# Patient Record
Sex: Male | Born: 1935 | Race: White | Hispanic: No | Marital: Married
Health system: Southern US, Community
[De-identification: ages and names within clinical notes are randomized; demographics above are authoritative.]

---

## 2006-10-25 ENCOUNTER — Inpatient Hospital Stay: Payer: Self-pay | Admitting: Vascular Surgery

## 2006-10-25 ENCOUNTER — Other Ambulatory Visit: Payer: Self-pay

## 2009-06-08 ENCOUNTER — Emergency Department: Payer: Self-pay | Admitting: Emergency Medicine

## 2009-09-09 ENCOUNTER — Emergency Department: Payer: Self-pay | Admitting: Emergency Medicine

## 2010-10-29 ENCOUNTER — Ambulatory Visit: Payer: Self-pay | Admitting: Internal Medicine

## 2011-07-06 ENCOUNTER — Encounter (INDEPENDENT_AMBULATORY_CARE_PROVIDER_SITE_OTHER): Payer: Medicare Other | Admitting: Ophthalmology

## 2011-07-06 DIAGNOSIS — H3530 Unspecified macular degeneration: Secondary | ICD-10-CM

## 2011-07-06 DIAGNOSIS — H43819 Vitreous degeneration, unspecified eye: Secondary | ICD-10-CM

## 2011-07-06 DIAGNOSIS — H353290 Exudative age-related macular degeneration, unspecified eye, stage unspecified: Secondary | ICD-10-CM

## 2011-08-19 ENCOUNTER — Encounter (INDEPENDENT_AMBULATORY_CARE_PROVIDER_SITE_OTHER): Payer: Medicare Other | Admitting: Ophthalmology

## 2011-08-19 DIAGNOSIS — H2700 Aphakia, unspecified eye: Secondary | ICD-10-CM

## 2011-08-19 DIAGNOSIS — H43819 Vitreous degeneration, unspecified eye: Secondary | ICD-10-CM

## 2011-08-19 DIAGNOSIS — H353290 Exudative age-related macular degeneration, unspecified eye, stage unspecified: Secondary | ICD-10-CM

## 2011-09-22 ENCOUNTER — Encounter (INDEPENDENT_AMBULATORY_CARE_PROVIDER_SITE_OTHER): Payer: Medicare Other | Admitting: Ophthalmology

## 2011-09-22 DIAGNOSIS — H353290 Exudative age-related macular degeneration, unspecified eye, stage unspecified: Secondary | ICD-10-CM

## 2011-09-22 DIAGNOSIS — H3530 Unspecified macular degeneration: Secondary | ICD-10-CM

## 2011-09-22 DIAGNOSIS — H43819 Vitreous degeneration, unspecified eye: Secondary | ICD-10-CM

## 2011-10-27 ENCOUNTER — Encounter (INDEPENDENT_AMBULATORY_CARE_PROVIDER_SITE_OTHER): Payer: Medicare Other | Admitting: Ophthalmology

## 2011-10-27 DIAGNOSIS — H3530 Unspecified macular degeneration: Secondary | ICD-10-CM

## 2011-10-27 DIAGNOSIS — H353290 Exudative age-related macular degeneration, unspecified eye, stage unspecified: Secondary | ICD-10-CM

## 2011-10-27 DIAGNOSIS — H43819 Vitreous degeneration, unspecified eye: Secondary | ICD-10-CM

## 2011-12-01 ENCOUNTER — Encounter (INDEPENDENT_AMBULATORY_CARE_PROVIDER_SITE_OTHER): Payer: Medicare Other | Admitting: Ophthalmology

## 2011-12-01 DIAGNOSIS — H43819 Vitreous degeneration, unspecified eye: Secondary | ICD-10-CM

## 2011-12-01 DIAGNOSIS — H3530 Unspecified macular degeneration: Secondary | ICD-10-CM

## 2011-12-01 DIAGNOSIS — H353290 Exudative age-related macular degeneration, unspecified eye, stage unspecified: Secondary | ICD-10-CM

## 2011-12-03 ENCOUNTER — Encounter (INDEPENDENT_AMBULATORY_CARE_PROVIDER_SITE_OTHER): Payer: Medicare Other | Admitting: Ophthalmology

## 2012-01-14 ENCOUNTER — Encounter (INDEPENDENT_AMBULATORY_CARE_PROVIDER_SITE_OTHER): Payer: Medicare Other | Admitting: Ophthalmology

## 2012-01-14 DIAGNOSIS — H3530 Unspecified macular degeneration: Secondary | ICD-10-CM

## 2012-01-14 DIAGNOSIS — H353290 Exudative age-related macular degeneration, unspecified eye, stage unspecified: Secondary | ICD-10-CM

## 2012-01-14 DIAGNOSIS — H43819 Vitreous degeneration, unspecified eye: Secondary | ICD-10-CM

## 2012-02-15 ENCOUNTER — Inpatient Hospital Stay: Payer: Self-pay | Admitting: Internal Medicine

## 2012-02-15 LAB — CBC
HCT: 41.6 % (ref 40.0–52.0)
HGB: 13.6 g/dL (ref 13.0–18.0)
MCH: 34.6 pg — ABNORMAL HIGH (ref 26.0–34.0)
MCV: 106 fL — ABNORMAL HIGH (ref 80–100)
RBC: 3.93 10*6/uL — ABNORMAL LOW (ref 4.40–5.90)
RDW: 15.3 % — ABNORMAL HIGH (ref 11.5–14.5)
WBC: 7.4 10*3/uL (ref 3.8–10.6)

## 2012-02-15 LAB — COMPREHENSIVE METABOLIC PANEL
Anion Gap: 9 (ref 7–16)
Calcium, Total: 8.8 mg/dL (ref 8.5–10.1)
Chloride: 104 mmol/L (ref 98–107)
Co2: 26 mmol/L (ref 21–32)
Creatinine: 1.29 mg/dL (ref 0.60–1.30)
EGFR (African American): >60
SGPT (ALT): 94 U/L — ABNORMAL HIGH
Sodium: 139 mmol/L (ref 136–145)
Total Protein: 6.8 g/dL (ref 6.4–8.2)

## 2012-02-15 LAB — TROPONIN I: Troponin-I: 0.03 ng/mL

## 2012-02-15 LAB — PRO B NATRIURETIC PEPTIDE: B-Type Natriuretic Peptide: 21499 pg/mL — ABNORMAL HIGH (ref 0–450)

## 2012-02-15 LAB — CK TOTAL AND CKMB (NOT AT ARMC): CK-MB: 3.1 ng/mL (ref 0.5–3.6)

## 2012-02-16 LAB — CK TOTAL AND CKMB (NOT AT ARMC)
CK, Total: 60 U/L (ref 35–232)
CK-MB: 2.5 ng/mL (ref 0.5–3.6)

## 2012-02-16 LAB — CBC WITH DIFFERENTIAL/PLATELET
Basophil #: 0 10*3/uL (ref 0.0–0.1)
Basophil %: 0.6 %
HGB: 12.4 g/dL — ABNORMAL LOW (ref 13.0–18.0)
Lymphocyte #: 1.7 10*3/uL (ref 1.0–3.6)
Lymphocyte %: 29.3 %
MCH: 34.7 pg — ABNORMAL HIGH (ref 26.0–34.0)
MCHC: 32.9 g/dL (ref 32.0–36.0)
MCV: 105 fL — ABNORMAL HIGH (ref 80–100)
Monocyte %: 12.5 %
Neutrophil #: 3.3 10*3/uL (ref 1.4–6.5)
Neutrophil %: 56.8 %
Platelet: 128 10*3/uL — ABNORMAL LOW (ref 150–440)
WBC: 5.9 10*3/uL (ref 3.8–10.6)

## 2012-02-16 LAB — BASIC METABOLIC PANEL
Calcium, Total: 8.2 mg/dL — ABNORMAL LOW (ref 8.5–10.1)
Creatinine: 1.39 mg/dL — ABNORMAL HIGH (ref 0.60–1.30)
Glucose: 101 mg/dL — ABNORMAL HIGH (ref 65–99)
Potassium: 4.4 mmol/L (ref 3.5–5.1)
Sodium: 144 mmol/L (ref 136–145)

## 2012-02-16 LAB — TROPONIN I: Troponin-I: 0.06 ng/mL — ABNORMAL HIGH

## 2012-02-17 LAB — CBC WITH DIFFERENTIAL/PLATELET
Basophil #: 0 10*3/uL (ref 0.0–0.1)
Basophil %: 0.2 %
Eosinophil %: 0.1 %
HCT: 34.6 % — ABNORMAL LOW (ref 40.0–52.0)
HGB: 11.3 g/dL — ABNORMAL LOW (ref 13.0–18.0)
MCH: 34.7 pg — ABNORMAL HIGH (ref 26.0–34.0)
MCHC: 32.7 g/dL (ref 32.0–36.0)
MCV: 106 fL — ABNORMAL HIGH (ref 80–100)
Monocyte #: 0.7 10*3/uL (ref 0.0–0.7)
Monocyte %: 9 %
Neutrophil #: 5.9 10*3/uL (ref 1.4–6.5)
Neutrophil %: 74.2 %
RBC: 3.26 10*6/uL — ABNORMAL LOW (ref 4.40–5.90)
WBC: 7.9 10*3/uL (ref 3.8–10.6)

## 2012-02-17 LAB — COMPREHENSIVE METABOLIC PANEL
Albumin: 2.6 g/dL — ABNORMAL LOW (ref 3.4–5.0)
BUN: 42 mg/dL — ABNORMAL HIGH (ref 7–18)
Bilirubin,Total: 0.5 mg/dL (ref 0.2–1.0)
Calcium, Total: 8.6 mg/dL (ref 8.5–10.1)
Creatinine: 1.43 mg/dL — ABNORMAL HIGH (ref 0.60–1.30)
EGFR (Non-African Amer.): 49 — ABNORMAL LOW
Glucose: 110 mg/dL — ABNORMAL HIGH (ref 65–99)

## 2012-02-18 LAB — CBC WITH DIFFERENTIAL/PLATELET
Basophil #: 0 10*3/uL (ref 0.0–0.1)
Eosinophil #: 0 10*3/uL (ref 0.0–0.7)
Eosinophil %: 0 %
HGB: 12.1 g/dL — ABNORMAL LOW (ref 13.0–18.0)
Lymphocyte %: 10.6 %
MCH: 34.9 pg — ABNORMAL HIGH (ref 26.0–34.0)
MCHC: 33.2 g/dL (ref 32.0–36.0)
MCV: 105 fL — ABNORMAL HIGH (ref 80–100)
Neutrophil #: 7.3 10*3/uL — ABNORMAL HIGH (ref 1.4–6.5)
Platelet: 141 10*3/uL — ABNORMAL LOW (ref 150–440)
WBC: 8.7 10*3/uL (ref 3.8–10.6)

## 2012-02-18 LAB — BASIC METABOLIC PANEL
Anion Gap: 13 (ref 7–16)
Calcium, Total: 8.8 mg/dL (ref 8.5–10.1)
Co2: 23 mmol/L (ref 21–32)
Creatinine: 1.4 mg/dL — ABNORMAL HIGH (ref 0.60–1.30)
EGFR (Non-African Amer.): 50 — ABNORMAL LOW
Osmolality: 297 (ref 275–301)
Potassium: 4.3 mmol/L (ref 3.5–5.1)
Sodium: 142 mmol/L (ref 136–145)

## 2012-02-21 LAB — CULTURE, BLOOD (SINGLE)

## 2012-02-25 ENCOUNTER — Encounter (INDEPENDENT_AMBULATORY_CARE_PROVIDER_SITE_OTHER): Payer: Medicare Other | Admitting: Ophthalmology

## 2012-02-25 DIAGNOSIS — H353290 Exudative age-related macular degeneration, unspecified eye, stage unspecified: Secondary | ICD-10-CM

## 2012-02-25 DIAGNOSIS — H3530 Unspecified macular degeneration: Secondary | ICD-10-CM

## 2012-02-25 DIAGNOSIS — H43819 Vitreous degeneration, unspecified eye: Secondary | ICD-10-CM

## 2012-02-27 ENCOUNTER — Inpatient Hospital Stay: Payer: Self-pay | Admitting: Internal Medicine

## 2012-02-27 LAB — CK TOTAL AND CKMB (NOT AT ARMC)
CK, Total: 116 U/L (ref 35–232)
CK-MB: 4.9 ng/mL — ABNORMAL HIGH (ref 0.5–3.6)

## 2012-02-27 LAB — COMPREHENSIVE METABOLIC PANEL
Albumin: 3.5 g/dL (ref 3.4–5.0)
Alkaline Phosphatase: 93 U/L (ref 50–136)
Anion Gap: 12 (ref 7–16)
Calcium, Total: 8.4 mg/dL — ABNORMAL LOW (ref 8.5–10.1)
Chloride: 104 mmol/L (ref 98–107)
Co2: 22 mmol/L (ref 21–32)
Glucose: 124 mg/dL — ABNORMAL HIGH (ref 65–99)
Osmolality: 290 (ref 275–301)
SGOT(AST): 53 U/L — ABNORMAL HIGH (ref 15–37)
Sodium: 138 mmol/L (ref 136–145)
Total Protein: 6.6 g/dL (ref 6.4–8.2)

## 2012-02-27 LAB — CBC
HCT: 41.3 % (ref 40.0–52.0)
HGB: 13.5 g/dL (ref 13.0–18.0)
MCH: 34.8 pg — ABNORMAL HIGH (ref 26.0–34.0)
MCHC: 32.7 g/dL (ref 32.0–36.0)
MCV: 106 fL — ABNORMAL HIGH (ref 80–100)
RDW: 14.6 % — ABNORMAL HIGH (ref 11.5–14.5)

## 2012-02-27 LAB — TSH: Thyroid Stimulating Horm: 6.23 u[IU]/mL — ABNORMAL HIGH

## 2012-02-27 LAB — POTASSIUM: Potassium: 4.5 mmol/L (ref 3.5–5.1)

## 2012-02-28 LAB — CBC WITH DIFFERENTIAL/PLATELET
Basophil #: 0 10*3/uL (ref 0.0–0.1)
Basophil %: 0.1 %
Eosinophil %: 0.1 %
HCT: 37.1 % — ABNORMAL LOW (ref 40.0–52.0)
HGB: 12.5 g/dL — ABNORMAL LOW (ref 13.0–18.0)
Monocyte #: 1 x10 3/mm (ref 0.2–1.0)
Monocyte %: 9.4 %
Neutrophil #: 8 10*3/uL — ABNORMAL HIGH (ref 1.4–6.5)
Neutrophil %: 78.8 %
RBC: 3.52 10*6/uL — ABNORMAL LOW (ref 4.40–5.90)
RDW: 15.2 % — ABNORMAL HIGH (ref 11.5–14.5)

## 2012-02-28 LAB — LIPID PANEL
Cholesterol: 146 mg/dL (ref 0–200)
Ldl Cholesterol, Calc: 60 mg/dL (ref 0–100)
VLDL Cholesterol, Calc: 30 mg/dL (ref 5–40)

## 2012-02-28 LAB — CK TOTAL AND CKMB (NOT AT ARMC)
CK, Total: 45 U/L (ref 35–232)
CK-MB: 1.7 ng/mL (ref 0.5–3.6)

## 2012-02-28 LAB — BASIC METABOLIC PANEL
BUN: 51 mg/dL — ABNORMAL HIGH (ref 7–18)
Calcium, Total: 7.6 mg/dL — ABNORMAL LOW (ref 8.5–10.1)
Co2: 21 mmol/L (ref 21–32)
Creatinine: 1.83 mg/dL — ABNORMAL HIGH (ref 0.60–1.30)
EGFR (Non-African Amer.): 31 — ABNORMAL LOW
Glucose: 167 mg/dL — ABNORMAL HIGH (ref 65–99)
Osmolality: 297 (ref 275–301)
Potassium: 4.3 mmol/L (ref 3.5–5.1)

## 2012-02-28 LAB — TROPONIN I
Troponin-I: 0.33 ng/mL — ABNORMAL HIGH
Troponin-I: 0.22 ng/mL — ABNORMAL HIGH

## 2012-02-29 LAB — CBC WITH DIFFERENTIAL/PLATELET
Eosinophil %: 0.7 %
HCT: 35.2 % — ABNORMAL LOW (ref 40.0–52.0)
HGB: 11.6 g/dL — ABNORMAL LOW (ref 13.0–18.0)
MCH: 35 pg — ABNORMAL HIGH (ref 26.0–34.0)
MCV: 106 fL — ABNORMAL HIGH (ref 80–100)
Monocyte #: 0.8 x10 3/mm (ref 0.2–1.0)
Monocyte %: 8.8 %
Neutrophil #: 6.5 10*3/uL (ref 1.4–6.5)
Neutrophil %: 74.5 %
Platelet: 133 10*3/uL — ABNORMAL LOW (ref 150–440)
RBC: 3.31 10*6/uL — ABNORMAL LOW (ref 4.40–5.90)
WBC: 8.7 10*3/uL (ref 3.8–10.6)

## 2012-02-29 LAB — BASIC METABOLIC PANEL
BUN: 48 mg/dL — ABNORMAL HIGH (ref 7–18)
Chloride: 105 mmol/L (ref 98–107)
Co2: 24 mmol/L (ref 21–32)
Creatinine: 2.02 mg/dL — ABNORMAL HIGH (ref 0.60–1.30)
Osmolality: 290 (ref 275–301)
Sodium: 139 mmol/L (ref 136–145)

## 2012-03-01 LAB — CBC WITH DIFFERENTIAL/PLATELET
Basophil #: 0 10*3/uL (ref 0.0–0.1)
Basophil %: 0.1 %
Eosinophil #: 0.1 10*3/uL (ref 0.0–0.7)
Eosinophil %: 0.8 %
HCT: 34.2 % — ABNORMAL LOW (ref 40.0–52.0)
HGB: 11.3 g/dL — ABNORMAL LOW (ref 13.0–18.0)
Lymphocyte #: 1.2 10*3/uL (ref 1.0–3.6)
Lymphocyte %: 13.8 %
MCV: 106 fL — ABNORMAL HIGH (ref 80–100)
Monocyte #: 0.6 x10 3/mm (ref 0.2–1.0)
Neutrophil #: 6.7 10*3/uL — ABNORMAL HIGH (ref 1.4–6.5)
Platelet: 144 10*3/uL — ABNORMAL LOW (ref 150–440)
RDW: 14.9 % — ABNORMAL HIGH (ref 11.5–14.5)
WBC: 8.6 10*3/uL (ref 3.8–10.6)

## 2012-03-01 LAB — BASIC METABOLIC PANEL
Anion Gap: 11 (ref 7–16)
Calcium, Total: 7.9 mg/dL — ABNORMAL LOW (ref 8.5–10.1)
Chloride: 106 mmol/L (ref 98–107)
Creatinine: 1.54 mg/dL — ABNORMAL HIGH (ref 0.60–1.30)
EGFR (African American): 44 — ABNORMAL LOW
EGFR (Non-African Amer.): 38 — ABNORMAL LOW
Sodium: 137 mmol/L (ref 136–145)

## 2012-03-02 LAB — CBC WITH DIFFERENTIAL/PLATELET
Basophil #: 0 10*3/uL (ref 0.0–0.1)
Basophil %: 0.2 %
Eosinophil #: 0.1 10*3/uL (ref 0.0–0.7)
Eosinophil %: 1.3 %
HCT: 36.1 % — ABNORMAL LOW (ref 40.0–52.0)
HGB: 12.2 g/dL — ABNORMAL LOW (ref 13.0–18.0)
Lymphocyte #: 1.3 10*3/uL (ref 1.0–3.6)
Lymphocyte %: 15 %
MCH: 35.9 pg — ABNORMAL HIGH (ref 26.0–34.0)
MCHC: 33.7 g/dL (ref 32.0–36.0)
MCV: 106 fL — ABNORMAL HIGH (ref 80–100)
Monocyte #: 0.7 x10 3/mm (ref 0.2–1.0)
Monocyte %: 7.8 %
Neutrophil #: 6.6 10*3/uL — ABNORMAL HIGH (ref 1.4–6.5)
Neutrophil %: 75.7 %
Platelet: 150 10*3/uL (ref 150–440)
RBC: 3.39 10*6/uL — ABNORMAL LOW (ref 4.40–5.90)
RDW: 15.1 % — ABNORMAL HIGH (ref 11.5–14.5)
WBC: 8.7 10*3/uL (ref 3.8–10.6)

## 2012-03-02 LAB — BASIC METABOLIC PANEL
Anion Gap: 6 — ABNORMAL LOW (ref 7–16)
BUN: 43 mg/dL — ABNORMAL HIGH (ref 7–18)
Calcium, Total: 8.2 mg/dL — ABNORMAL LOW (ref 8.5–10.1)
Chloride: 104 mmol/L (ref 98–107)
Co2: 24 mmol/L (ref 21–32)
Creatinine: 1.64 mg/dL — ABNORMAL HIGH (ref 0.60–1.30)
EGFR (African American): 41 — ABNORMAL LOW
EGFR (Non-African Amer.): 35 — ABNORMAL LOW
Glucose: 93 mg/dL (ref 65–99)
Osmolality: 279 (ref 275–301)
Potassium: 4.7 mmol/L (ref 3.5–5.1)
Sodium: 134 mmol/L — ABNORMAL LOW (ref 136–145)

## 2012-04-06 ENCOUNTER — Encounter (INDEPENDENT_AMBULATORY_CARE_PROVIDER_SITE_OTHER): Payer: Medicare Other | Admitting: Ophthalmology

## 2012-04-06 DIAGNOSIS — H353290 Exudative age-related macular degeneration, unspecified eye, stage unspecified: Secondary | ICD-10-CM

## 2012-04-06 DIAGNOSIS — H43819 Vitreous degeneration, unspecified eye: Secondary | ICD-10-CM

## 2012-04-06 DIAGNOSIS — H3530 Unspecified macular degeneration: Secondary | ICD-10-CM

## 2012-05-15 DEATH — deceased

## 2012-05-25 ENCOUNTER — Encounter (INDEPENDENT_AMBULATORY_CARE_PROVIDER_SITE_OTHER): Payer: Medicare Other | Admitting: Ophthalmology

## 2012-09-01 IMAGING — CR DG CHEST 2V
1 series · 3 of 3 positions shown · non-contrast
Comparison: none

REASON FOR EXAM: Shortness of breath
COMMENTS:

[Series 1: pa · 0.17mm/px · 3 of 3 slices shown]
[im 1/3]
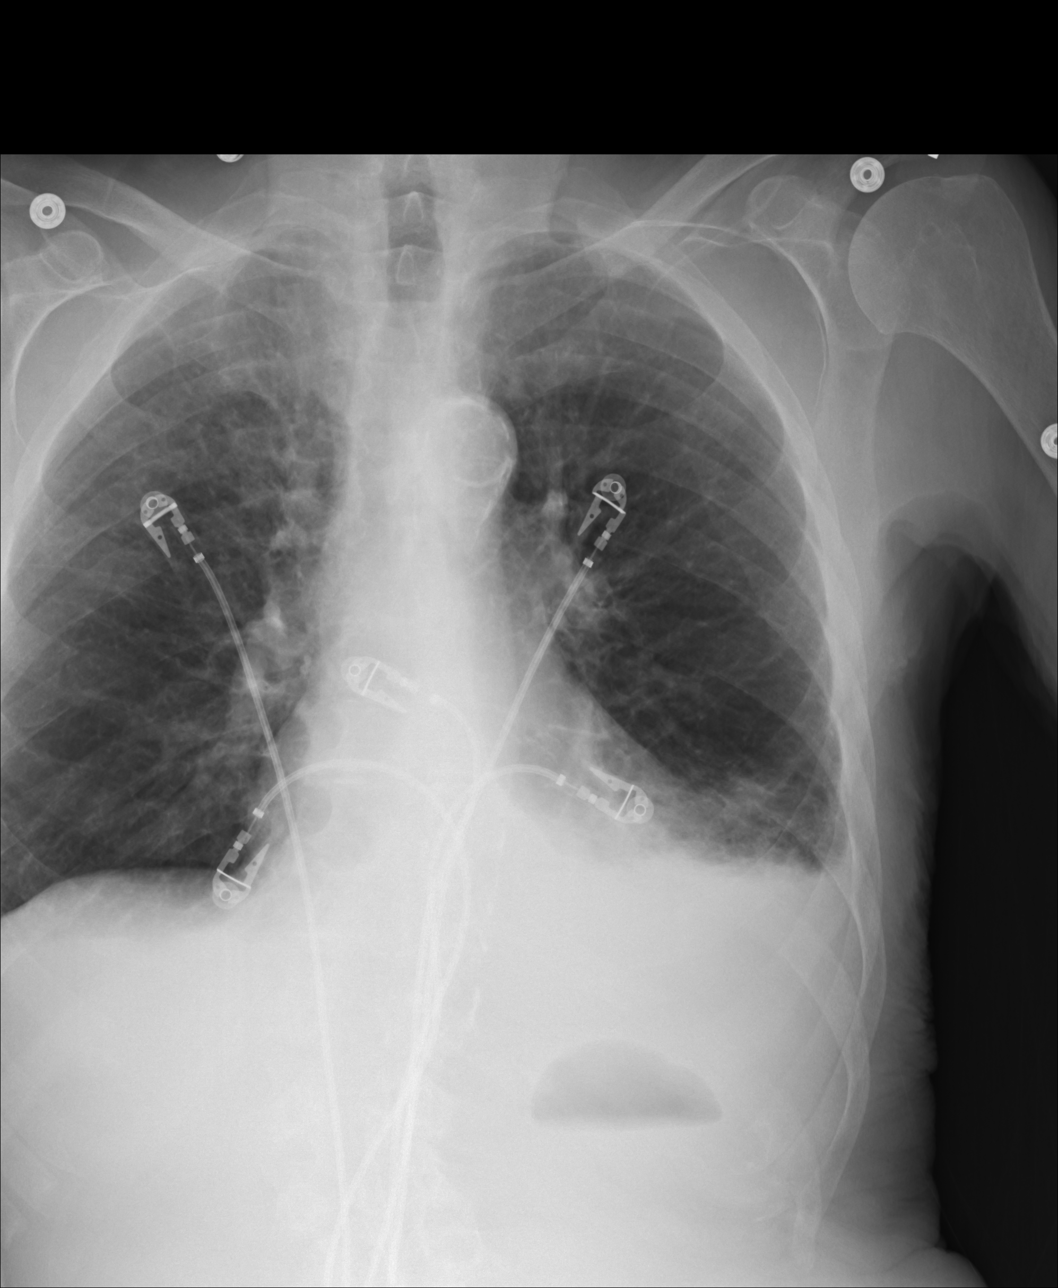
[im 2/3]
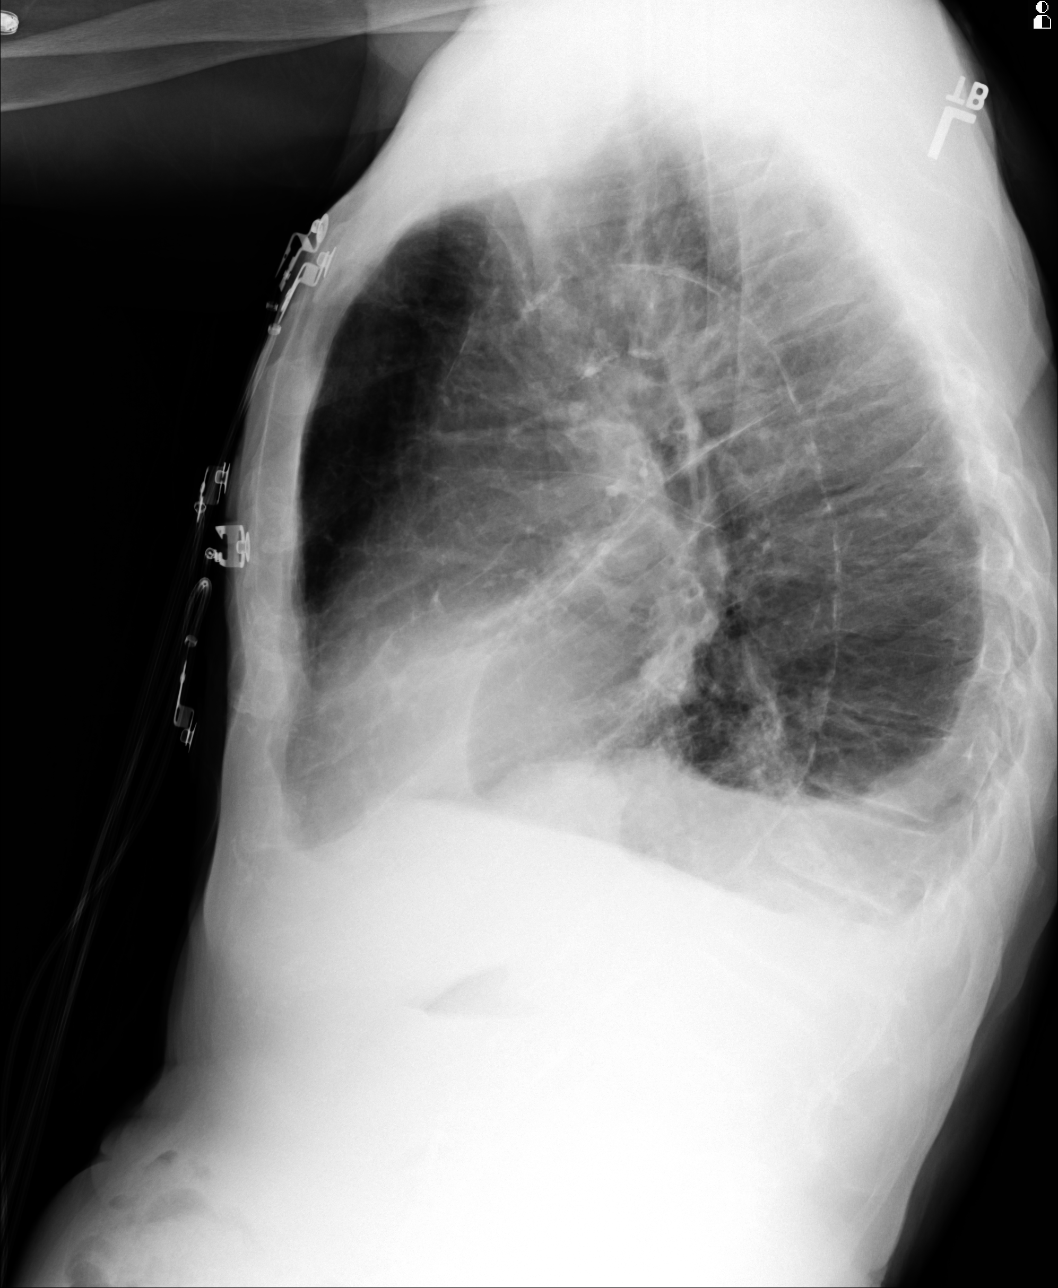
[im 3/3]
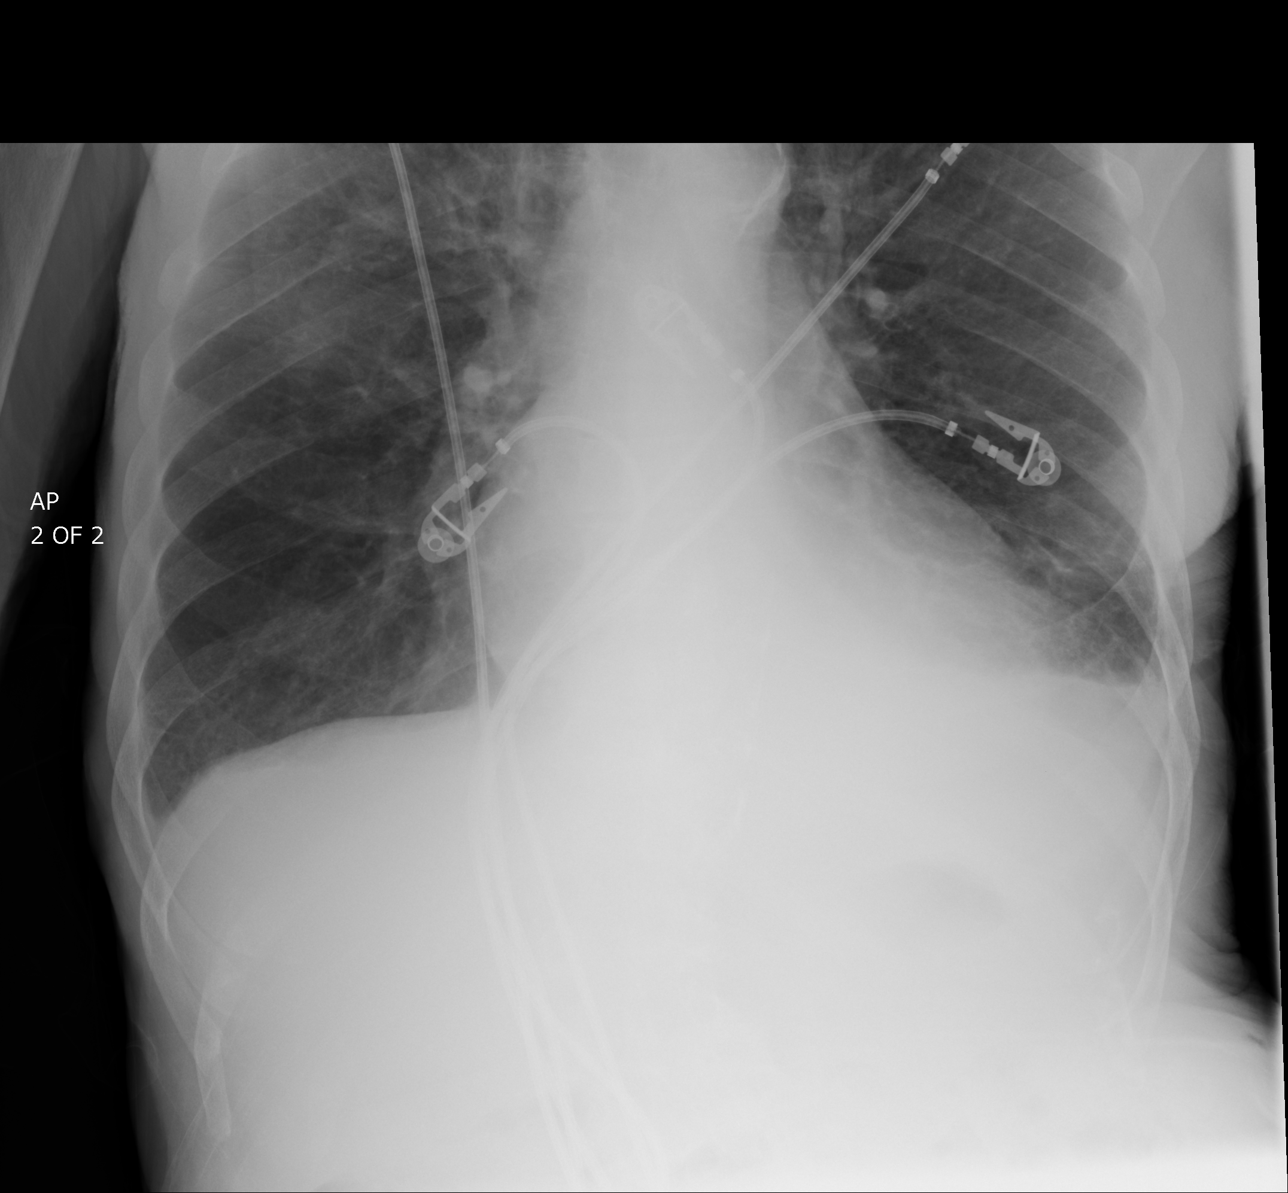

[3 of 3 positions shown; findings below may reference images not displayed]

PROCEDURE:     DXR - DXR CHEST PA (OR AP) AND LATERAL  - March 01, 2012 [DATE]

RESULT:     Comparison is made to the prior exam of 02/29/2012. There is
again noted blunting of the left costophrenic angle compatible with a small
left pleural effusion. There is also thickening of the left basilar markings
compatible with associated infiltrate or atelectasis. Overall, the
appearance shows little or no appreciable interval change as compared to the
exam of yesterday.

In the lateral view there is blunting of the right posterior gutter
compatible with a trace right effusion that appears to have decreased as
compared to the exam of yesterday. No new pulmonary infiltrates are seen.
The general appearance of the chest is consistent with CHF superimposed on
COPD. The heart is mildly enlarged but has not changed appreciably in size
since the exam of yesterday.
IMPRESSION: 1. There persists a left pleural effusion superior to which there is
thickening of the lung markings compatible with infiltrate or atelectasis.
2. There is a trace right effusion, decreased as compared to the exam of
yesterday.
3. COPD.
4. The general appearance of the chest is compatible with CHF superimposed
on COPD.

## 2015-03-09 NOTE — Discharge Summary (Signed)
PATIENT NAME:  Eric York, [name] MR#:  [id number] DATE OF BIRTH:  04-Mar-1915  DATE OF ADMISSION:  02/27/2012 DATE OF DISCHARGE:  03/02/2012  REASON FOR ADMISSION: Chest pain.   HISTORY OF PRESENT ILLNESS: Please see the dictated history of present illness done by Dr. Auburn BilberryShreyang Patel on 02/27/2012.   PAST MEDICAL HISTORY:  1. Peripheral vascular disease.  2. Glaucoma.  3. Chronic anemia. 4. Benign hypertension.  5. Chronic obstructive pulmonary disease.  6. History of transient ischemic attacks.  7. Type 2 diabetes.  8. Stage III chronic kidney disease.  9. Severe dilated cardiomyopathy.  10. Severe mitral regurgitation.  11. Anxiety.  12. Hypothyroidism. 13. History of subdural hematoma.  14. Chronic insomnia.  15. Peptic ulcer disease status post partial gastrectomy.  16. Status post right carotid endarterectomy.   MEDICATIONS ON ADMISSION: Please see admission note.   ALLERGIES: Penicillin.  SOCIAL HISTORY/FAMILY HISTORY/REVIEW OF SYSTEMS: As per admission note.   PHYSICAL EXAM: The patient was in no acute distress. Vital signs were stable except for a heart rate of 121. HEENT exam was unremarkable. Neck was supple without jugular venous distention. Lungs revealed decreased breath sounds. Cardiac examination revealed an irregularly irregular rhythm. There was a systolic murmur noted throughout the precordium. Abdomen was soft and nontender. Extremities revealed 2+ edema. Neurologic exam was grossly nonfocal.   HOSPITAL COURSE: The patient was admitted with a mildly elevated troponin and chest discomfort. He also had some underlying chronic obstructive pulmonary disease. The patient was seen in consultation by cardiology for his chest pain, elevated troponin, and chronic atrial fibrillation. Medical management was recommended. Cardiology did not feel that the patient had a myocardial infarction, but that the elevated troponin was due to demand ischemia. The patient was treated  conservatively for his cardiac and pulmonary symptoms with improvement of his medical status. He was seen in consultation by pulmonology who agreed with medical management. The patient's respiratory status improved. He was subsequently switched to p.o. medications with improvement of his symptoms. Placement was recommended but the family deferred. Hospice was offered but the family deferred. After being moved out of the Intensive Care Unit, the patient stabilized on telemetry with no further problems. He was seen in consultation by physical therapy. He was subsequently discharged home on 03/02/2012 in stable condition.   DISCHARGE DIAGNOSES:  1. Presumed pneumonia.  2. Chronic obstructive pulmonary disease exacerbation.  3. Acute on chronic respiratory failure.  4. Chest discomfort due to demand ischemia with elevated troponin.  5. Chronic systolic congestive heart failure.  6. Chronic atrial fibrillation.  7. Benign hypertension.  8. Benign prostatic hypertrophy.  9. Hypothyroidism.  10. Type 2 diabetes.   DISCHARGE MEDICATIONS:  1. Timolol eyedrops as directed.  2. Klonopin 1.5 mg p.o. at bedtime.  3. Synthroid 0.075 mg p.o. daily.  4. Symbicort 160/4.5 one puff twice a day. 5. Allopurinol 100 mg p.o. daily.  6. Proscar 5 mg p.o. daily.  7. Norco 5/325 mg 1 to 2 p.o. every four hours p.r.n. pain.  8. Protonix 40 mg p.o. daily.  9. Iron sulfate 325 mg p.o. daily.  10. Oxybutynin 5 mg p.o. daily.  11. Mucinex 600 mg p.o. twice a day. 12. Spiriva 1 capsule inhaled daily.  13. Aspirin 325 mg p.o. daily.  14. Nitrostat p.r.n. chest pain.  15. Cardizem CD 180 mg p.o. daily.  16. Ceftin 250 mg p.o. twice a day x1 week.   FOLLOW-UP PLANS AND APPOINTMENTS: The patient was discharged on a low sodium  diet. He will be followed by home health. He will follow up with me within one week's time period, sooner if needed.  ____________________________ Duane Lope Judithann Sheen, MD jds:slb D: 03/05/2012  14:26:51 ET T: 03/06/2012 10:11:48 ET JOB#: [id number]  cc: Duane Lope. Judithann Sheen, MD, <Dictator> Malakhi Markwood Rodena Medin MD ELECTRONICALLY SIGNED 03/06/2012 11:49

## 2015-03-09 NOTE — H&P (Signed)
PATIENT NAME:  Eric York, Eric York MR#:  [id number] DATE OF BIRTH:  April 17, 1915  DATE OF ADMISSION:  02/15/2012  REFERRING PHYSICIAN: Dr. Buford Dresser PRIMARY CARE PHYSICIAN: Dr. Judithann Sheen; also seen at the [facility]  PRESENTING COMPLAINT: Cough, wheezing, worsening shortness of breath.   HISTORY OF PRESENT ILLNESS: Mr. Tinnell is a pleasant 81 year old gentleman with history of peripheral vascular disease, chronic obstructive pulmonary disease, benign prostatic hypertrophy, diabetes, chronic kidney disease who presents today with complaints of developing difficulty breathing for the past two days that has been worsening according to patient, was seen by Dr. Judithann Sheen on 01/26/2012 and was treated for bronchitis, received Depo-Medrol as well as Rocephin IM shot with discharge on doxycycline, he still has four more days till completion. Has been getting better until two days ago when he started having worsening symptoms of cough, wheezing as well as difficulty breathing. He was in distress before being brought in by EMS. He arrived on a BiPAP. He reports that he has usual cough to clear his throat but his cough has been productive with yellow sputum. Denies any hemoptysis. Reports that he took Symbicort with some minor relief. Reports now that his symptoms have improved. He denies any chest pain. No fevers, nausea or vomiting. He did have some cold sweats and presyncope.   PAST MEDICAL HISTORY:  1. Peripheral vascular disease.  2. Glaucoma.  3. Hypertension.  4. Anemia.  5. Chronic obstructive pulmonary disease.  6. Transient ischemic attacks.  7. Diabetes, diet controlled.  8. Chronic kidney disease, stage III.  9. Anxiety.  10. Chronic insomnia.  11. Peptic ulcer disease status post partial gastrectomy.  12. Hypothyroidism.    PAST SURGICAL HISTORY:  1. Status post partial gastrectomy as above.  2. Status post right carotid endarterectomy.   ALLERGIES: Documented penicillin, however, patient reports  receiving penicillin when he had his CEA without any consequences.   MEDICATIONS:  1. Albuterol inhaler as needed.  2. Symbicort as needed.  3. Cholecalciferol 1000 international units daily.  4. Clonazepam 1.75 mg at bedtime.  5. B12 1000 mcg daily.  6. Doxycycline 100 mg b.i.d., he has four more days left.  7. Ferrous sulfate 325 mg b.i.d.  8. Finasteride 5 mg daily.  9. Hydrochlorothiazide 12.5 mg daily.  10. Levothyroxine 25 mcg daily.  11. Oxybutynin chloride 5 mg daily.  12. Timolol 0.5% ophthalmic solution one drop to each eye b.i.d.   SOCIAL HISTORY: Lives in [place] with his wife. He quit tobacco in 1971. No alcohol or drug use.   FAMILY HISTORY: Mother had cerebrovascular accident. He is one out of eight children.   REVIEW OF SYSTEMS: CONSTITUTIONAL: No fevers, nausea, vomiting. EYES: History of glaucoma. ENT: Denies epistaxis or discharge. RESPIRATORY: As per history of present illness. CARDIOVASCULAR: No chest pain, orthopnea. He endorses presyncope but no syncope. GASTROINTESTINAL: No nausea, vomiting. Endorses constipation. No bloody stool. GENITOURINARY: No dysuria, hematuria. ENDO: No polyuria, polydipsia. HEMATOLOGIC: No easy bleeding. SKIN: No ulcers. MUSCULOSKELETAL: He has occasional joint discomfort. NEUROLOGIC: History of transient ischemic attacks. PSYCH: He endorses depression but no suicidal ideation.   PHYSICAL EXAMINATION:  VITAL SIGNS: Pulse 85, respiratory rate 18, blood pressure 172/107, sating 98% on 35% face mask currently, his last blood pressure reading was 149/86.   GENERAL: Sitting in bed in no apparent distress.   HEENT: Normocephalic, atraumatic. Pupils are equal, symmetric. Face mask in place. He has slightly dry mucous membrane.   NECK: Soft and supple. No adenopathy. He has an elevated JVP of  approximately 7 cm.   CARDIOVASCULAR: Non-tachy. No murmurs, rubs, or gallops.   LUNGS: Diffuse faint wheezing, coarse breath sounds. No use of  accessory muscles or increased respiratory effort.   ABDOMEN: Soft. Positive bowel sounds. No mass appreciated.   EXTREMITIES: No edema. Dorsal pedis pulses intact.   MUSCULOSKELETAL: No joint effusion.   SKIN: No ulcers.   NEUROLOGIC: No dysarthria or aphasia. Symmetrical strength. No focal deficits.   PSYCH: He is alert and oriented. Patient is cooperative.   LABORATORY, DIAGNOSTIC AND RADIOLOGICAL DATA: Chest x-ray with suggested chronic obstructive pulmonary disease and superimposed low-grade congestive heart failure. No focal pneumonia. BNP 21,499, CK 101, MB 3.1, WBC 7.4, hemoglobin 13.6, hematocrit 41.6, platelet 157, MCV 106, glucose 152, BUN 32, creatinine 1.29, sodium 139, potassium 4.9, chloride 104, carbon dioxide 26, calcium 8.8, total bilirubin 0.9, alkaline phosphatase 107, ALT 94, AST 161, total protein 6.8. Troponin 0.03. EKG with sinus rate of 85. No ST elevation or depression. He has no Q waves.   ASSESSMENT AND PLAN: Mr. Nolen MuMcKinney is a pleasant 81 year old gentleman with history of chronic obstructive pulmonary disease, hypertension, peripheral vascular disease, chronic kidney disease, diabetes diet controlled presenting with worsening shortness of breath, productive cough, wheezing.  1. Chronic obstructive pulmonary disease exacerbation, multifactorial with acute bronchitis plus/minus congestive heart failure. No prior history of congestive heart failure. His BNP is elevated, will repeat prior to discharge. Cycle cardiac enzymes. Continue on tele. Obtain an echocardiogram. Received Lasix in the Emergency Department. Will stop his hydrochlorothiazide and start on low-dose Lasix daily for now. Continue ins and outs, daily weights, and daily BMP. Continue oxygen, SVNs, Solu-Medrol. Stop his doxycycline and start on Levaquin and Spiriva. Send sputum culture.  2. Hypertension. As above, holding hydrochlorothiazide.  3. Diabetes, diet controlled. Start sliding scale insulin.   4. Hypothyroidism. Restart his Synthroid.  5. Prophylaxis with heparin sub-Q and Protonix.   TIME SPENT: Approximately 50 minutes spent on patient care.    ____________________________ Reuel DerbyAlounthith Janea Schwenn, MD ap:cms D: 02/15/2012 23:13:23 ET T: 02/16/2012 07:43:44 ET JOB#: [id number]  cc: Pearlean BrownieAlounthith Jasiah Buntin, MD, <Dictator> Duane LopeJeffrey D. Judithann SheenSparks, MD [facility] Reuel DerbyALOUNTHITH Perri Aragones MD ELECTRONICALLY SIGNED 03/10/2012 22:57

## 2015-03-09 NOTE — Discharge Summary (Signed)
PATIENT NAME:  Eric York, Eric York MR#:  [id number] DATE OF BIRTH:  1915-09-04  DATE OF ADMISSION:  02/15/2012 DATE OF DISCHARGE:  02/18/2012  REASON FOR ADMISSION: Worsening shortness of breath.   HISTORY OF PRESENT ILLNESS: Please see the dictated history of present illness done by Dr. Margie EgePhichith on 02/15/2012.    PAST MEDICAL HISTORY:  1. Peripheral vascular disease.  2. Stage III chronic kidney disease.  3. Glaucoma.  4. Benign hypertension.  5. Chronic anemia.  6. Chronic obstructive pulmonary disease.  7. Transient ischemic attacks.  8. Type II diabetes.  9. Anxiety.  10. Chronic insomnia. 11. History of peptic ulcer disease. 12. Hypothyroidism.   MEDICATIONS ON ADMISSION: Please see admission note.   ALLERGIES: Penicillin.   SOCIAL HISTORY, FAMILY HISTORY, AND REVIEW OF SYSTEMS: As per admission note.   PHYSICAL EXAMINATION: The patient was elderly in no acute distress. Vital signs were remarkable for a blood pressure of 172/107 with a heart rate of 85 and a respiratory rate of 18. HEENT exam was unremarkable. NECK was supple with JVD noted. LUNGS revealed diffuse wheezes and rhonchi. CARDIAC exam regular rate and rhythm with normal S1, S2. ABDOMEN soft and nontender. EXTREMITIES without edema. NEUROLOGIC exam was grossly nonfocal.   LABORATORY DATA: Laboratory data revealed a BNP of 21,499. Chest x-ray revealed chronic obstructive pulmonary disease with congestive heart failure.   HOSPITAL COURSE: The patient was admitted with acute respiratory failure with underlying COPD exacerbation as well as congestive heart failure. Echocardiogram revealed an ejection fraction of 20%. He was diuresed with Lasix with improvement of his symptoms. He was also placed on oxygen, SVNs, Solu-Medrol, and antibiotics for his COPD exacerbation and presumed bronchitis. The patient did well with conservative therapy. He was easily weaned down off of his oxygen. He was ambulating with a walker. By  02/18/2012, the patient was stable and ready for discharge.   DISCHARGE DIAGNOSES:  1. Acute respiratory failure.  2. Chronic obstructive pulmonary disease exacerbation.  3. Presumed bronchitis.  4. Acute systolic congestive heart failure.  5. Ischemic cardiomyopathy.  6. Chronic anemia.   DISCHARGE MEDICATIONS:  1. Timolol eyedrops as directed.  2. Klonopin 0.75 mg p.o. at bedtime.  3. Synthroid 0.075 mg p.o. daily.  4. Symbicort 2 puffs b.i.York.  5. Allopurinol 100 mg p.o. daily.  6. Proscar 5 mg p.o. daily.  7. Levaquin 500 mg p.o. daily x10 days.  8. Prednisone taper as directed.  9. Protonix 40 mg p.o. daily.  10. Iron sulfate 325 mg p.o. daily. 11. Oxybutynin 5 mg p.o. daily.  12. Mucinex 600 mg p.o. b.i.York.  13. Diltiazem 30 mg p.o. q.6 hours.  14. Spiriva 1 capsule inhaled daily.  15. Norco 1 to 2 p.o. q.4 hours p.r.n. pain.   FOLLOW-UP PLANS AND APPOINTMENTS:  1. The patient was discharged on a low sodium diet.  2. He will be followed by home health care. 3. Follow-up with me in 1 to 2 weeks, sooner if needed.   ____________________________ Duane LopeJeffrey York. Judithann SheenSparks, MD jds:drc York: 02/26/2012 00:31:26 ET T: 02/28/2012 08:46:36 ET JOB#: [id number]  cc: Duane LopeJeffrey York. Judithann SheenSparks, MD, <Dictator> Nabria Nevin Rodena Medin Krystopher Kuenzel MD ELECTRONICALLY SIGNED 02/29/2012 15:05

## 2015-03-09 NOTE — Consult Note (Signed)
Brief Consult Note: Diagnosis: CP, atypical, borderline elevated troponin. probable demand/ supply ischemia secondary to severe DCM, COPD, CKD.   Patient was seen by consultant.   Consult note dictated.   Comments: REC  Agree with current therapy, defer full dose anticoagulatio, defer further cardiac w/u, may Tx to floor.  Electronic Signatures: Marcina MillardParaschos, Shamir Sedlar (MD)  (Signed 15-Apr-13 08:55)  Authored: Brief Consult Note   Last Updated: 15-Apr-13 08:55 by Marcina MillardParaschos, Quamesha Mullet (MD)

## 2015-03-09 NOTE — H&P (Signed)
PATIENT NAME:  Eric York, Eric York MR#:  [id number] DATE OF BIRTH:  1915/05/01  DATE OF ADMISSION:  02/27/2012  PRIMARY MD: Dr. Judithann Sheen    ED REFERRING DOCTOR: Dr. Governor Rooks    CHIEF COMPLAINT: Chest pain.   HISTORY OF PRESENT ILLNESS: The patient is a 81 year old white male who was hospitalized here from 04/02 to 04/05. At that time he was discharged home. At that time he was diagnosed with COPD, presumed bronchitis, and was discharged home. The patient reports that after being discharged he finished a course of his prednisone and antibiotics. He was doing okay from his breathing standpoint and then earlier this morning he started having pain in the right shoulder. Subsequently, the pain went to his chest. He came to ED. In the ED he was noted to have a troponin that was elevated at 0.28. Therefore, I am asked to admit the patient. The patient reports that prior to this episode he has not had any chest pain. He described chest pain as a "soreness in the chest". He reports that the pain is worse with taking a deep breath. In terms of the right arm pain, he describes it as a sharp type of pain which is now resolved. He denies any radiation of the pain to his neck. He denies any nausea or vomiting. He reports that he is not having any shortness of breath. He is not having any coughing. He, otherwise, denies any fevers or chills. No abdominal pain. No nausea, vomiting, or diarrhea. Denies any urinary frequency, urgency, or hesitancy.   PAST MEDICAL HISTORY:  1. Peripheral vascular disease.  2. Glaucoma.  3. Hypertension.  4. Chronic anemia.  5. Chronic obstructive pulmonary disease.  6. Transient ischemic attack.  7. Diabetes, diet controlled.  8. Chronic kidney disease, stage III.  9. Severe cardiomyopathy with a recent echo that showed EF of 20% with severe MR and severe TR.  10. Anxiety disorder.  11. Chronic insomnia.  12. Peptic ulcer disease, status post partial gastrectomy.   13. Hypothyroidism.  14. Recent subdural hematoma status post evacuation at [facility].  15. Status post partial gastrectomy.  16. Status post right carotid endarterectomy.   ALLERGIES: Allergies documented to penicillin, however, the patient reports receiving penicillin when he had his carotid endarterectomy without any consequences.   CURRENT MEDICATIONS: Same as discharge according to the patient: 1. Timolol 0.5% one drop to each affected eye two times per day.  2. Clonazepam 0.5 1-1/2 tabs at bedtime.  3. Levothyroxine 75 mcg daily.  4. Symbicort 1 puff b.i.d.  5. Allopurinol 100 daily.  6. Finasteride 5 mg daily.   7. Norco 1 to 2 tabs q.4 p.r.n. pain.  8. Protonix 40 daily.  9. Iron sulfate 325 mg p.o. daily.  10. Oxybutynin 5 p.o. daily.  11. Mucinex 600 p.o. b.i.d.  12. Spiriva 1 capsule inhalation daily.  13. Diltiazem 30 mg q.6 hours.   SOCIAL HISTORY: Lives in [place] with his wife. He quit tobacco in 1971. No alcohol or drug use.   FAMILY HISTORY: Mother had a CVA. He is one of eight children.   REVIEW OF SYSTEMS: CONSTITUTIONAL: Denies any fevers, fatigue, or weakness. Complains of chest pain as above. No weight loss. No weight gain. EYES: No blurred or double vision. No redness. No inflammation. Does have history of glaucoma involving the right eye. Also has some sort of retinal disorder with leaking. Blood vessel requires intraocular injections every six weeks. ENT: Denies any tinnitus. Does have age  related hearing loss. No ear pain. No allergies, seasonal or year round. No epistaxis. No nasal discharge. No postnasal drip. Denies any difficulty swallowing. RESPIRATORY: Denies any cough or wheezing. No hemoptysis. Does have COPD. No TB. CARDIOVASCULAR: Chest pain as above. No orthopnea. No edema. No arrhythmia. No palpitations. No syncope. GI: No nausea, vomiting, diarrhea. No abdominal pain. No hematemesis. No melena. No ulcers. Does have history of peptic ulcer disease. Does  complain of intermittent constipation. GU: Denies any dysuria, hematuria, renal calculus, or frequency. ENDOCRINE: Denies any polydipsia, nocturia, or thyroid problems. Denies any increase in sweating, heat or cold intolerance. HEME/LYMPH: Denies anemia, easy bruisability or bleeding. SKIN: No acne. No rash. No changes in mole, hair or skin. MUSCULOSKELETAL: No pain in neck, back, or shoulder. Has history of gout. NEUROLOGIC: No numbness. No weakness. No CVA. No TIA. PSYCHIATRIC: No anxiety. No insomnia. No ADD. No OCD.   PHYSICAL EXAMINATION:   VITAL SIGNS: Temperature 98.2, pulse 121, respirations 26, blood pressure 107/79, O2 96%.   GENERAL: The patient is an elderly male in no acute distress, appears his stated age.   HEENT: Head atraumatic, normocephalic. Pupils equal, round, reactive to light and accommodation. There is no conjunctival pallor. No scleral icterus. Nasal exam shows no drainage or ulceration. Oropharynx is clear without any exudates.   NECK: There is no thyromegaly. No carotid bruits.   CARDIOVASCULAR: Irregularly irregular rhythm. He has a systolic murmur with radiation.   LUNGS: He has diminished breath sounds without any active rales, rhonchi, or wheezing.   ABDOMEN: Soft, nontender, nondistended. Positive bowel sounds x4. There is no hepatosplenomegaly.   EXTREMITIES: 2+ edema.   SKIN: He has some age related skin changes. No rash.   VASCULAR: Diminished DP, PT pulses in both lower extremities.   PSYCHIATRIC: Not anxious or depressed.   NEUROLOGICAL: The patient is awake, alert, oriented x3. Cranial nerves II through XII grossly intact. No focal deficits.   LYMPH NODES: No lymph nodes palpable.   LABORATORY, DIAGNOSTIC, AND RADIOLOGICAL DATA: EKG shows atrial fibrillation with nonspecific ST-T wave changes.   Troponin 0.28. CPK 116. CK-MB 4.9. Glucose 124, BUN 50, creatinine 1.68, sodium 138, potassium 5.7, chloride 104, CO2 22, calcium 8.4, alkaline  phosphatase 93. LFTs showed a slightly elevated AST of 53. TSH 8.82.   ASSESSMENT AND PLAN: The patient is a 81 year old with history of peripheral vascular disease, chronic obstructive pulmonary disease, benign prostatic hypertrophy, diabetes, which is diet controlled, chronic kidney disease who presents with right-sided shoulder pain as well as substernal chest pain.  1. Chest pain with elevated cardiac enzymes. The patient's pain is a little atypical because it is worse with deep breathing. However, his cardiac enzymes do reflect possible cardiac cause. At this time will admit the patient. Check serial enzymes. Cardiology consult in the a.m. I will also check a D-dimer in light of his symptoms with pain with deep breath. If elevated, consider V/Q scan. Will continue him on aspirin. Nitroglycerin p.r.n.  2. Severe cardiomyopathy with severe MR and TR. I have updated the patient's family regarding this.  3. Chronic obstructive pulmonary disease. Will continue Symbicort and Spiriva as taking at home.  4. Diabetes, type II, which is diet controlled. Will place him on ADA diet and place him on sliding scale insulin.  5. Atrial fibrillation, likely chronic in nature. Will continue Cardizem p.o. Continue aspirin. I do not think he would be a good Coumadin candidate.  6. Acute renal failure on chronic renal failure. Has  severe cardiomyopathy with severe peripheral edema. Will monitor his renal function.  7. Elevated TSH. Will check a free T4  to see if he needs any treatment.  8. Hyperkalemia, possibly lab error. Will repeat a BMP. If still elevated, he will need Kayexalate.   TIME SPENT: 35 minutes.   ____________________________ Lacie ScottsShreyang H. Allena KatzPatel, MD shp:drc D: 02/27/2012 18:54:00 ET T: 02/28/2012 05:42:56 ET JOB#: [id number] cc: Pamla Pangle H. Allena KatzPatel, MD, <Dictator>, Duane LopeJeffrey D. Judithann SheenSparks, MD Charise CarwinSHREYANG H Makyla Bye MD ELECTRONICALLY SIGNED 02/28/2012 13:14

## 2015-03-09 NOTE — Consult Note (Signed)
PATIENT NAME:  Eric MoselleMCKINNEY, Doron D MR#:  [id number] DATE OF BIRTH:  [birth year]  DATE OF CONSULTATION:  02/28/2012  REFERRING PHYSICIAN:  Aram BeechamJeffrey Sparks, MD CONSULTING PHYSICIAN:  Marcina MillardAlexander Edell Mesenbrink, MD  CHIEF COMPLAINT: Chest pain.   HISTORY OF PRESENT ILLNESS: The patient is a 81 year old gentleman referred for evaluation of chest pain and borderline elevated troponin. The patient was recently hospitalized for chronic obstructive pulmonary disease and bronchitis and discharged home. The patient [facility] on 02/17/2012 complaining of chest discomfort. In the emergency room, the patient had borderline elevated troponin. The patient reports he experiences the chest discomfort described as soreness when he takes a breath in. These symptoms apparently have resolved. Admission labs were notable for a troponin of 0.28 and BUN and creatinine of 50 and 1.68, respectively. Follow-up troponin was 0.33.   PAST MEDICAL HISTORY:  1. Severe dilated cardiomyopathy with LVEF of 20% with severe MR and TR.  2. Hypertension.  3. Chronic kidney disease, stage III. 4. Chronic obstructive pulmonary disease.  5. Chronic anemia.  6. History of transient ischemic attack. 7. Anxiety disorder.  8. Hypothyroidism.   MEDICATIONS ON ADMISSION:  1. Diltiazem 30 mg p.o. every six hours. 2. Timolol eyedrops  0.5% one drop to each affected by twice a day. 3. Clonazepam 0.5 mg 1-1/2 tabs at bedtime.  4. Levothyroxine 75 mcg daily.  5. Symbicort 1 puff twice a day. 6. Allopurinol 100 mg daily.  7. Finasteride 5 mg daily.  8. Norco 1 to 2 tabs every four hours p.r.n.  9. Protonix 40 mg daily.  10. Iron sulfate 325 mg daily.  11. Oxybutynin 5 mg daily.  12. Mucinex 600 mg twice a day. 13. Spiriva 1 capsule inhalation daily.   SOCIAL HISTORY: The patient lives with his wife. He quit tobacco abuse in 1971.   FAMILY HISTORY: No immediate family history of coronary artery disease or myocardial  infarction.   REVIEW OF SYSTEMS: CONSTITUTIONAL: No fever or chills. EYES: No blurry vision. EARS: No hearing loss. RESPIRATORY: Some mild residual shortness of breath as described above. CARDIOVASCULAR: Chest pain as described above. GASTROINTESTINAL: No nausea, vomiting, diarrhea, or constipation. GU: No dysuria or hematuria. ENDOCRINE: No polyuria or polydipsia. HEMATOLOGICAL: No easy bruising or bleeding. MUSCULOSKELETAL: No arthralgias or myalgias. NEUROLOGICAL: No focal muscle weakness or numbness. PSYCHOLOGICAL: No depression or anxiety.   PHYSICAL EXAMINATION:   VITAL SIGNS: Blood pressure 85/46, pulse 94, respirations 25, and pulse oximetry 100%.   HEENT: Pupils are equal and reactive to light and accommodation.   NECK: Supple without thyromegaly.   LUNGS: Decreased breath sounds in both bases.   HEART: Normal jugular venous pressure. Normal point of maximal impulse. Irregular, irregular rhythm. Normal S1 and S2. No appreciable gallop, murmur, or rub.   ABDOMEN: Soft and nontender. Pulses were intact bilaterally.   MUSCULOSKELETAL: Normal muscle tone.   NEUROLOGIC: The patient is alert and oriented x3. Motor and sensory are both grossly intact.   IMPRESSION: This is a 81 year old gentleman with known severe dilated cardiomyopathy who presents with chest pain with atypical features which sounds noncardiac. The patient has borderline elevated troponin in the setting of cardiomyopathy, recent chronic obstructive pulmonary disease with bronchitis, and chronic kidney disease. This is likely due to supply demand ischemia, unlikely due to acute coronary syndrome.   RECOMMENDATIONS:  1. Defer full dose anticoagulation. 2. Defer further noninvasive or invasive cardiac evaluation.  3. May transfer to the floor. ____________________________ Marcina MillardAlexander Nedim Oki, MD ap:slb D: 02/28/2012 08:53:49  ET T: 02/28/2012 11:41:44 ET JOB#: [id number]  cc: Marcina Millard, MD,  <Dictator> Marcina Millard MD ELECTRONICALLY SIGNED 03/01/2012 8:47
